# Patient Record
Sex: Male | Born: 1950 | Race: White | Hispanic: No | Marital: Married | Smoking: Never smoker
Health system: Southern US, Community
[De-identification: ages and names within clinical notes are randomized; demographics above are authoritative.]

## PROBLEM LIST (undated history)

## (undated) DIAGNOSIS — E785 Hyperlipidemia, unspecified: Secondary | ICD-10-CM

## (undated) DIAGNOSIS — R7301 Impaired fasting glucose: Secondary | ICD-10-CM

## (undated) HISTORY — DX: Impaired fasting glucose: R73.01

## (undated) HISTORY — DX: Hyperlipidemia, unspecified: E78.5

---

## 2003-01-17 ENCOUNTER — Ambulatory Visit (HOSPITAL_COMMUNITY): Admission: RE | Admit: 2003-01-17 | Discharge: 2003-01-17 | Payer: Self-pay | Admitting: Gastroenterology

## 2006-01-26 ENCOUNTER — Encounter: Admission: RE | Admit: 2006-01-26 | Discharge: 2006-01-26 | Payer: Self-pay | Admitting: Orthopedic Surgery

## 2010-11-18 ENCOUNTER — Encounter: Payer: Self-pay | Admitting: Orthopedic Surgery

## 2017-04-28 ENCOUNTER — Other Ambulatory Visit: Payer: Self-pay | Admitting: Family Medicine

## 2017-04-28 ENCOUNTER — Ambulatory Visit
Admission: RE | Admit: 2017-04-28 | Discharge: 2017-04-28 | Disposition: A | Discharge status: Home / Self Care | Payer: Medicare Other | Source: Ambulatory Visit | Attending: Family Medicine | Admitting: Family Medicine

## 2017-04-28 DIAGNOSIS — M545 Low back pain, unspecified: Secondary | ICD-10-CM

## 2017-04-28 DIAGNOSIS — R3129 Other microscopic hematuria: Secondary | ICD-10-CM

## 2017-04-29 ENCOUNTER — Other Ambulatory Visit: Payer: Self-pay | Admitting: Family Medicine

## 2017-04-29 DIAGNOSIS — K769 Liver disease, unspecified: Secondary | ICD-10-CM

## 2017-05-11 ENCOUNTER — Ambulatory Visit
Admission: RE | Admit: 2017-05-11 | Discharge: 2017-05-11 | Disposition: A | Discharge status: Home / Self Care | Payer: Medicare Other | Source: Ambulatory Visit | Attending: Family Medicine | Admitting: Family Medicine

## 2017-05-11 DIAGNOSIS — K769 Liver disease, unspecified: Secondary | ICD-10-CM

## 2017-05-11 MED ORDER — GADOBENATE DIMEGLUMINE 529 MG/ML IV SOLN
16.0000 mL | Freq: Once | INTRAVENOUS | Status: AC | PRN
  Administered 2017-05-11: 16 mL via INTRAVENOUS

## 2019-11-23 ENCOUNTER — Ambulatory Visit: Payer: Medicare Other

## 2019-12-02 ENCOUNTER — Ambulatory Visit: Payer: Medicare PPO | Attending: Internal Medicine

## 2019-12-02 DIAGNOSIS — Z23 Encounter for immunization: Secondary | ICD-10-CM | POA: Insufficient documentation

## 2019-12-02 NOTE — Progress Notes (Signed)
   Covid-19 Vaccination Clinic  Name:  Derrick Allen    MRN: [id number] DOB: Dec 10, 1950  12/02/2019  Derrick Allen was observed post Covid-19 immunization for 15 minutes without incidence. He was provided with Vaccine Information Sheet and instruction to access the V-Safe system.   Derrick Allen was instructed to call 911 with any severe reactions post vaccine: Marland Kitchen Difficulty breathing  . Swelling of your face and throat  . A fast heartbeat  . A bad rash all over your body  . Dizziness and weakness    Immunizations Administered    Name Date Dose VIS Date Route   Pfizer COVID-19 Vaccine 12/02/2019  6:06 PM 0.3 mL 10/08/2019 Intramuscular   Manufacturer: [place]   Lot: [id number]   [place]: [id number]

## 2019-12-04 ENCOUNTER — Ambulatory Visit: Payer: Medicare Other

## 2019-12-28 ENCOUNTER — Ambulatory Visit: Payer: Medicare PPO | Attending: Internal Medicine

## 2019-12-28 DIAGNOSIS — Z23 Encounter for immunization: Secondary | ICD-10-CM | POA: Insufficient documentation

## 2019-12-28 NOTE — Progress Notes (Signed)
   Covid-19 Vaccination Clinic  Name:  Derrick Allen    MRN: [id number] DOB: [birth year]  12/28/2019  [name] was observed post Covid-19 immunization for 15 minutes without incident. He was provided with Vaccine Information Sheet and instruction to access the V-Safe system.   [name] was instructed to call 911 with any severe reactions post vaccine: Marland Kitchen Difficulty breathing  . Swelling of face and throat  . A fast heartbeat  . A bad rash all over body  . Dizziness and weakness   Immunizations Administered    Name Date Dose VIS Date Route   Pfizer COVID-19 Vaccine 12/28/2019  9:25 AM 0.3 mL 10/08/2019 Intramuscular   Manufacturer: [place]   Lot: [id number]   [place]: [id number]

## 2019-12-31 DIAGNOSIS — G43019 Migraine without aura, intractable, without status migrainosus: Secondary | ICD-10-CM | POA: Diagnosis not present

## 2019-12-31 DIAGNOSIS — G43719 Chronic migraine without aura, intractable, without status migrainosus: Secondary | ICD-10-CM | POA: Diagnosis not present

## 2020-01-12 DIAGNOSIS — M47818 Spondylosis without myelopathy or radiculopathy, sacral and sacrococcygeal region: Secondary | ICD-10-CM | POA: Diagnosis not present

## 2020-01-12 DIAGNOSIS — M47816 Spondylosis without myelopathy or radiculopathy, lumbar region: Secondary | ICD-10-CM | POA: Diagnosis not present

## 2020-01-12 DIAGNOSIS — R030 Elevated blood-pressure reading, without diagnosis of hypertension: Secondary | ICD-10-CM | POA: Diagnosis not present

## 2020-01-12 DIAGNOSIS — M545 Low back pain: Secondary | ICD-10-CM | POA: Diagnosis not present

## 2020-02-08 DIAGNOSIS — M47818 Spondylosis without myelopathy or radiculopathy, sacral and sacrococcygeal region: Secondary | ICD-10-CM | POA: Diagnosis not present

## 2020-03-08 DIAGNOSIS — M5416 Radiculopathy, lumbar region: Secondary | ICD-10-CM | POA: Diagnosis not present

## 2020-03-08 DIAGNOSIS — M47816 Spondylosis without myelopathy or radiculopathy, lumbar region: Secondary | ICD-10-CM | POA: Diagnosis not present

## 2020-03-08 DIAGNOSIS — M47818 Spondylosis without myelopathy or radiculopathy, sacral and sacrococcygeal region: Secondary | ICD-10-CM | POA: Diagnosis not present

## 2020-03-08 DIAGNOSIS — M545 Low back pain: Secondary | ICD-10-CM | POA: Diagnosis not present

## 2020-03-14 ENCOUNTER — Other Ambulatory Visit: Payer: Self-pay | Admitting: Neurosurgery

## 2020-03-14 DIAGNOSIS — M5416 Radiculopathy, lumbar region: Secondary | ICD-10-CM

## 2020-04-15 ENCOUNTER — Other Ambulatory Visit: Payer: Self-pay

## 2020-04-15 ENCOUNTER — Ambulatory Visit
Admission: RE | Admit: 2020-04-15 | Discharge: 2020-04-15 | Disposition: A | Discharge status: Home / Self Care | Payer: Medicare PPO | Source: Ambulatory Visit | Attending: Neurosurgery | Admitting: Neurosurgery

## 2020-04-15 DIAGNOSIS — M5416 Radiculopathy, lumbar region: Secondary | ICD-10-CM

## 2020-04-15 DIAGNOSIS — M48061 Spinal stenosis, lumbar region without neurogenic claudication: Secondary | ICD-10-CM | POA: Diagnosis not present

## 2020-04-19 DIAGNOSIS — M5136 Other intervertebral disc degeneration, lumbar region: Secondary | ICD-10-CM | POA: Diagnosis not present

## 2020-04-19 DIAGNOSIS — M48061 Spinal stenosis, lumbar region without neurogenic claudication: Secondary | ICD-10-CM | POA: Diagnosis not present

## 2020-04-19 DIAGNOSIS — M47816 Spondylosis without myelopathy or radiculopathy, lumbar region: Secondary | ICD-10-CM | POA: Diagnosis not present

## 2020-04-19 DIAGNOSIS — M545 Low back pain: Secondary | ICD-10-CM | POA: Diagnosis not present

## 2020-04-20 DIAGNOSIS — H401231 Low-tension glaucoma, bilateral, mild stage: Secondary | ICD-10-CM | POA: Diagnosis not present

## 2020-04-28 DIAGNOSIS — M47816 Spondylosis without myelopathy or radiculopathy, lumbar region: Secondary | ICD-10-CM | POA: Diagnosis not present

## 2020-05-22 DIAGNOSIS — M47816 Spondylosis without myelopathy or radiculopathy, lumbar region: Secondary | ICD-10-CM | POA: Diagnosis not present

## 2020-06-14 DIAGNOSIS — M545 Low back pain: Secondary | ICD-10-CM | POA: Diagnosis not present

## 2020-06-14 DIAGNOSIS — M47816 Spondylosis without myelopathy or radiculopathy, lumbar region: Secondary | ICD-10-CM | POA: Diagnosis not present

## 2020-06-14 DIAGNOSIS — M48061 Spinal stenosis, lumbar region without neurogenic claudication: Secondary | ICD-10-CM | POA: Diagnosis not present

## 2020-06-14 DIAGNOSIS — M5136 Other intervertebral disc degeneration, lumbar region: Secondary | ICD-10-CM | POA: Diagnosis not present

## 2020-07-25 DIAGNOSIS — G43019 Migraine without aura, intractable, without status migrainosus: Secondary | ICD-10-CM | POA: Diagnosis not present

## 2020-07-25 DIAGNOSIS — G43719 Chronic migraine without aura, intractable, without status migrainosus: Secondary | ICD-10-CM | POA: Diagnosis not present

## 2020-07-27 DIAGNOSIS — E785 Hyperlipidemia, unspecified: Secondary | ICD-10-CM | POA: Diagnosis not present

## 2020-07-27 DIAGNOSIS — Z8669 Personal history of other diseases of the nervous system and sense organs: Secondary | ICD-10-CM | POA: Diagnosis not present

## 2020-07-27 DIAGNOSIS — H4089 Other specified glaucoma: Secondary | ICD-10-CM | POA: Diagnosis not present

## 2020-07-27 DIAGNOSIS — Z79899 Other long term (current) drug therapy: Secondary | ICD-10-CM | POA: Diagnosis not present

## 2020-07-27 DIAGNOSIS — N400 Enlarged prostate without lower urinary tract symptoms: Secondary | ICD-10-CM | POA: Diagnosis not present

## 2020-07-27 DIAGNOSIS — Z125 Encounter for screening for malignant neoplasm of prostate: Secondary | ICD-10-CM | POA: Diagnosis not present

## 2020-07-27 DIAGNOSIS — Z0000 Encounter for general adult medical examination without abnormal findings: Secondary | ICD-10-CM | POA: Diagnosis not present

## 2020-07-27 DIAGNOSIS — R7309 Other abnormal glucose: Secondary | ICD-10-CM | POA: Diagnosis not present

## 2020-07-27 DIAGNOSIS — Z86010 Personal history of colonic polyps: Secondary | ICD-10-CM | POA: Diagnosis not present

## 2020-07-27 DIAGNOSIS — R7303 Prediabetes: Secondary | ICD-10-CM | POA: Diagnosis not present

## 2020-08-12 ENCOUNTER — Ambulatory Visit: Payer: Medicare PPO

## 2020-08-24 ENCOUNTER — Other Ambulatory Visit: Payer: Self-pay

## 2020-08-24 ENCOUNTER — Encounter: Payer: Self-pay | Admitting: Cardiology

## 2020-08-24 ENCOUNTER — Ambulatory Visit: Payer: Medicare PPO | Admitting: Cardiology

## 2020-08-24 VITALS — BP 118/70 | HR 60 | Ht 72.0 in | Wt 167.0 lb

## 2020-08-24 DIAGNOSIS — Z7182 Exercise counseling: Secondary | ICD-10-CM

## 2020-08-24 DIAGNOSIS — Z7189 Other specified counseling: Secondary | ICD-10-CM

## 2020-08-24 DIAGNOSIS — R7301 Impaired fasting glucose: Secondary | ICD-10-CM | POA: Diagnosis not present

## 2020-08-24 DIAGNOSIS — E785 Hyperlipidemia, unspecified: Secondary | ICD-10-CM

## 2020-08-24 DIAGNOSIS — R011 Cardiac murmur, unspecified: Secondary | ICD-10-CM | POA: Diagnosis not present

## 2020-08-24 DIAGNOSIS — Z713 Dietary counseling and surveillance: Secondary | ICD-10-CM | POA: Diagnosis not present

## 2020-08-24 NOTE — Patient Instructions (Signed)
Medication Instructions:  Your Physician recommend you continue on your current medication as directed.    *If you need a refill on your cardiac medications before your next appointment, please call your pharmacy*   Lab Work: None ordered   Testing/Procedures: Your physician has requested that you have an echocardiogram. Echocardiography is a painless test that uses sound waves to create images of your heart. It provides your doctor with information about the size and shape of your heart and how well your heart's chambers and valves are working. This procedure takes approximately one hour. There are no restrictions for this procedure. [place] [address]    Follow-Up: At [facility], you and your health needs are our priority.  As part of our continuing mission to provide you with exceptional heart care, we have created designated Provider Care Teams.  These Care Teams include your primary Cardiologist (physician) and Advanced Practice Providers (APPs -  Physician Assistants and Nurse Practitioners) who all work together to provide you with the care you need, when you need it.  We recommend signing up for the patient portal called "MyChart".  Sign up information is provided on this After Visit Summary.  MyChart is used to connect with patients for Virtual Visits (Telemedicine).  Patients are able to view lab/test results, encounter notes, upcoming appointments, etc.  Non-urgent messages can be sent to your provider as well.   To learn more about what you can do with MyChart, go to [email or web address].    Your next appointment:   As needed  The format for your next appointment:   In Person  Provider:   Buford Dresser, MD

## 2020-08-24 NOTE — Progress Notes (Signed)
Cardiology Office Note:    Date:  08/24/2020   ID:  Derrick Allen, DOB 07-08-1951, MRN [id number]  PCP:  Hulan Fess, MD  Cardiologist:  Buford Dresser, MD  Referring MD: Hulan Fess, MD   CC: new patient evaluation for murmur  History of Present Illness:    Derrick Allen is a 69 y.o. male with a hx of impaired fasting glucose, dyslipidemia who is seen as a new consult at the request of Hulan Fess, MD for the evaluation and management of murmur.  Note from 07/27/20 with Dr. Rex Kras reviewed. Noted to have 2/6 systolic murmur on exam.   Cardiovascular risk factors: Prior clinical ASCVD: never Comorbid conditions: Denies hypertension, chronic kidney disease. Endorses abnormal lipids, prediabetes Metabolic syndrome/Obesity: highest adult weight 180 lbs, BMI 22 today Chronic inflammatory conditions: none Tobacco use history: never Family history: no family history of heart disease that he knows of. Mother had a stroke around age 37.  Prior cardiac testing and/or incidental findings on other testing (ie coronary calcium): none Exercise level: goes to the gym daily. Does strenuous cardio without issue, at least 60 mins 6 days/week.  Current diet: generally healthy. Oatmeal, fruit, boiled egg for breakfast. Rare bacon. Lunch is greek yogurt, [place] breast, white cheese. Dinner is cooked by his wife, generally very healthy, usually chicken/fish/pork, rare red meat, with a vegetable. Has one shot of alcohol nightly. Working to cut back on sweets, this is his weakness.  Past Medical History:  Diagnosis Date  . Dyslipidemia   . Impaired fasting glucose     History reviewed. No pertinent surgical history.  Current Medications: Current Outpatient Medications on File Prior to Visit  Medication Sig  . latanoprost (XALATAN) 0.005 % ophthalmic solution Place 1 drop into both eyes daily.  . simvastatin (ZOCOR) 40 MG tablet Take 40 mg by mouth daily.  . SUMAtriptan (IMITREX) 25 MG  tablet Take 25 mg by mouth as needed.  . venlafaxine XR (EFFEXOR-XR) 150 MG 24 hr capsule Take 150 mg by mouth daily.  Marland Kitchen zonisamide (ZONEGRAN) 100 MG capsule Take 100 mg by mouth daily.   No current facility-administered medications on file prior to visit.     Allergies:   Amoxicillin   Social History   Tobacco Use  . Smoking status: Never Smoker  . Smokeless tobacco: Never Used  Substance Use Topics  . Alcohol use: Not on file  . Drug use: Not on file    Family History: family history includes AAA (abdominal aortic aneurysm) in his mother; Colon cancer in his mother.  ROS:   Please see the history of present illness.  Additional pertinent ROS: Constitutional: Negative for chills, fever, night sweats, unintentional weight loss  HENT: Negative for ear pain and hearing loss.   Eyes: Negative for loss of vision and eye pain.  Respiratory: Negative for cough, sputum, wheezing.   Cardiovascular: See HPI. Gastrointestinal: Negative for abdominal pain, melena, and hematochezia.  Genitourinary: Negative for dysuria and hematuria.  Musculoskeletal: Negative for falls and myalgias.  Skin: Negative for itching and rash.  Neurological: Negative for focal weakness, focal sensory changes and loss of consciousness.  Endo/Heme/Allergies: Does not bruise/bleed easily.     EKGs/Labs/Other Studies Reviewed:    The following studies were reviewed today: No prior cardiac studies  EKG:  EKG is personally reviewed.  The ekg ordered today demonstrates NSR at 60 bpm  Recent Labs: No results found for requested labs within last 8760 hours.  Recent Lipid Panel No results  found for: CHOL, TRIG, HDL, CHOLHDL, VLDL, LDLCALC, LDLDIRECT  Physical Exam:    VS:  BP 118/70 (BP Location: Left Arm, Patient Position: Sitting, Cuff Size: Normal)   Pulse 60   Ht 6' (1.829 m)   Wt 167 lb (75.8 kg)   BMI 22.65 kg/m     Wt Readings from Last 3 Encounters:  08/24/20 167 lb (75.8 kg)    GEN: Well  nourished, well developed in no acute distress HEENT: Normal, moist mucous membranes NECK: No JVD CARDIAC: regular rhythm, normal S1 and S2, no rubs or gallops. 2/6 systolic murmur best hear at apex VASCULAR: Radial and DP pulses 2+ bilaterally. No carotid bruits RESPIRATORY:  Clear to auscultation without rales, wheezing or rhonchi  ABDOMEN: Soft, non-tender, non-distended MUSCULOSKELETAL:  Ambulates independently SKIN: Warm and dry, no edema NEUROLOGIC:  Alert and oriented x 3. No focal neuro deficits noted. PSYCHIATRIC:  Normal affect    ASSESSMENT:    1. Murmur, cardiac   2. Cardiac risk counseling   3. Counseling on health promotion and disease prevention   4. Nutritional counseling   5. Exercise counseling   6. Dyslipidemia   7. Impaired fasting glucose    PLAN:    Murmur: -given location, quality, suspect this is mitral regurgitation. Would not be surprised if there is also component of aortic sclerosis -we discussed what causes murmurs at length today and how these are evaluated -no high risk features such as syncope, chest pain, shortness of breath/edema -will order echocardiogram for baseline  Dyslipidemia Impaired fasting glucose -per [facility], 07/27/20: -Tchol 191. HDL 79, LDL 102, TG 56 -A1c 6.1 -discussed prevention, below -excellent diet, exercise, weight -working on cutting back sweets -his ASCVD risk without diabetes is 12.7% over ten years. This is largely driven by age, and is actually under his optimal risk of 13.1% per the calculator -if he does develop diabetes, would need to discuss statin/aspirin, wishes to avoid medications at this time  Cardiac risk counseling and prevention recommendations: already doing an excellent job with this. We discussed CV recommendations at length, including nutrition and exercise -recommend heart healthy/Mediterranean diet, with whole grains, fruits, vegetable, fish, lean meats, nuts, and olive oil. Limit salt. -recommend  moderate walking, 3-5 times/week for 30-50 minutes each session. Aim for at least 150 minutes.week. Goal should be pace of 3 miles/hours, or walking 1.5 miles in 30 minutes -recommend avoidance of tobacco products. Avoid excess alcohol.  Plan for follow up: if echo without high risk findings, can follow up as needed  Buford Dresser, MD, PhD [place]  [facility]    Medication Adjustments/Labs and Tests Ordered: Current medicines are reviewed at length with the patient today.  Concerns regarding medicines are outlined above.  Orders Placed This Encounter  Procedures  . EKG 12-Lead  . ECHOCARDIOGRAM COMPLETE   No orders of the defined types were placed in this encounter.   Patient Instructions  Medication Instructions:  Your Physician recommend you continue on your current medication as directed.    *If you need a refill on your cardiac medications before your next appointment, please call your pharmacy*   Lab Work: None ordered   Testing/Procedures: Your physician has requested that you have an echocardiogram. Echocardiography is a painless test that uses sound waves to create images of your heart. It provides your doctor with information about the size and shape of your heart and how well your heart's chambers and valves are working. This procedure takes approximately one hour. There are  no restrictions for this procedure. [place] [address]    Follow-Up: At [facility], you and your health needs are our priority.  As part of our continuing mission to provide you with exceptional heart care, we have created designated Provider Care Teams.  These Care Teams include your primary Cardiologist (physician) and Advanced Practice Providers (APPs -  Physician Assistants and Nurse Practitioners) who all work together to provide you with the care you need, when you need it.  We recommend signing up for the patient portal called "MyChart".  Sign up information  is provided on this After Visit Summary.  MyChart is used to connect with patients for Virtual Visits (Telemedicine).  Patients are able to view lab/test results, encounter notes, upcoming appointments, etc.  Non-urgent messages can be sent to your provider as well.   To learn more about what you can do with MyChart, go to [email or web address].    Your next appointment:   As needed  The format for your next appointment:   In Person  Provider:   Buford Dresser, MD       Signed, Buford Dresser, MD PhD 08/24/2020 1:03 PM    [place]

## 2020-09-19 ENCOUNTER — Ambulatory Visit (HOSPITAL_COMMUNITY): Payer: Medicare PPO | Attending: Internal Medicine

## 2020-09-19 ENCOUNTER — Other Ambulatory Visit: Payer: Self-pay

## 2020-09-19 DIAGNOSIS — R011 Cardiac murmur, unspecified: Secondary | ICD-10-CM | POA: Insufficient documentation

## 2020-09-19 LAB — ECHOCARDIOGRAM COMPLETE
AR max vel: 2.14 cm2
AV Area VTI: 2.14 cm2
AV Area mean vel: 2.18 cm2
AV Mean grad: 9 mmHg
AV Peak grad: 14.4 mmHg
Ao pk vel: 1.9 m/s
Area-P 1/2: 3.31 cm2
S' Lateral: 2.8 cm

## 2020-09-27 ENCOUNTER — Ambulatory Visit: Payer: Medicare PPO | Admitting: Dermatology

## 2020-09-27 ENCOUNTER — Other Ambulatory Visit: Payer: Self-pay

## 2020-09-27 DIAGNOSIS — L309 Dermatitis, unspecified: Secondary | ICD-10-CM

## 2020-09-27 DIAGNOSIS — L570 Actinic keratosis: Secondary | ICD-10-CM | POA: Diagnosis not present

## 2020-09-27 MED ORDER — HALOBETASOL PROPIONATE 0.05 % EX CREA: TOPICAL_CREAM | Freq: Two times a day (BID) | CUTANEOUS | 0 refills | Status: DC | PRN

## 2020-09-27 MED ORDER — TOLAK 4 % EX CREA: 1.0000 "application " | TOPICAL_CREAM | Freq: Every day | CUTANEOUS | 0 refills | Status: AC

## 2020-10-01 ENCOUNTER — Encounter: Payer: Self-pay | Admitting: Dermatology

## 2020-10-01 NOTE — Progress Notes (Signed)
   Follow-Up Visit   Subjective  Derrick Allen is a 69 y.o. male who presents for the following: Skin Problem (Patients PCP sent him here to have a lesion on scalp looked at. Some red crusty spots. Does not bother patient but PCP says he may need some treatment. ). Crusts Location: Scalp Duration:  Quality:  Associated Signs/Symptoms: Modifying Factors:  Severity:  Timing: Context:   Objective  Well appearing patient in no apparent distress; mood and affect are within normal limits.  All skin waist up examined.   Assessment & Plan    AK (actinic keratosis) Mid Occipital Scalp  Prescribed [place].  Goal would be 28 total applications which can be used nightly to the affected area or, if too much irritation occurs, take a week off and restart every other night.  May cause moderate sun sensitivity.  Please call if there is too much irritation.  Ordered Medications: Fluorouracil (TOLAK) 4 % CREA  Dermatitis Gluteal Crease  Halobetasol will be applied once daily after bathing for maximum 30 days.  Please call the office at that time.  If improved, we will use a milder long-term cortisone or perhaps a noncortisone prescription like tacrolimus ointment.  Ordered Medications: halobetasol (ULTRAVATE) 0.05 % cream     I, Lavonna Monarch, MD, have reviewed all documentation for this visit.  The documentation on 10/01/20 for the exam, diagnosis, procedures, and orders are all accurate and complete.

## 2020-10-04 DIAGNOSIS — J019 Acute sinusitis, unspecified: Secondary | ICD-10-CM | POA: Diagnosis not present

## 2020-12-08 DIAGNOSIS — M19012 Primary osteoarthritis, left shoulder: Secondary | ICD-10-CM | POA: Diagnosis not present

## 2020-12-08 DIAGNOSIS — M1712 Unilateral primary osteoarthritis, left knee: Secondary | ICD-10-CM | POA: Diagnosis not present

## 2020-12-12 DIAGNOSIS — H401231 Low-tension glaucoma, bilateral, mild stage: Secondary | ICD-10-CM | POA: Diagnosis not present

## 2021-01-12 DIAGNOSIS — G43019 Migraine without aura, intractable, without status migrainosus: Secondary | ICD-10-CM | POA: Diagnosis not present

## 2021-01-12 DIAGNOSIS — G43719 Chronic migraine without aura, intractable, without status migrainosus: Secondary | ICD-10-CM | POA: Diagnosis not present

## 2021-01-19 DIAGNOSIS — M1712 Unilateral primary osteoarthritis, left knee: Secondary | ICD-10-CM | POA: Diagnosis not present

## 2021-02-28 ENCOUNTER — Ambulatory Visit: Payer: Medicare PPO | Admitting: Dermatology

## 2021-02-28 ENCOUNTER — Encounter: Payer: Self-pay | Admitting: Dermatology

## 2021-02-28 ENCOUNTER — Other Ambulatory Visit: Payer: Self-pay

## 2021-02-28 DIAGNOSIS — Z1283 Encounter for screening for malignant neoplasm of skin: Secondary | ICD-10-CM

## 2021-02-28 DIAGNOSIS — D1801 Hemangioma of skin and subcutaneous tissue: Secondary | ICD-10-CM

## 2021-02-28 DIAGNOSIS — L738 Other specified follicular disorders: Secondary | ICD-10-CM

## 2021-02-28 DIAGNOSIS — L821 Other seborrheic keratosis: Secondary | ICD-10-CM | POA: Diagnosis not present

## 2021-03-15 ENCOUNTER — Encounter: Payer: Self-pay | Admitting: Dermatology

## 2021-03-15 NOTE — Progress Notes (Signed)
   Follow-Up Visit   Subjective  Derrick Allen is a 70 y.o. male who presents for the following: Annual Exam (Check back per wife- no new concerns).  General skin examination Location:  Duration:  Quality: Wife has noticed some change in spots on back Associated Signs/Symptoms: Modifying Factors:  Severity:  Timing: Context:   Objective  Well appearing patient in no apparent distress; mood and affect are within normal limits. Objective  Mid Back: Full body skin exam. No atypical moles or non mole skin cancers.   Objective  Mid Back: Tan textured brown flattopped 3-day millimeter papules lesions.   Objective  Left Forehead: Subtle 2 mm flesh-colored eccentrically delled papule  Objective  Chest - Medial [facility]): 2 mm smooth red papule    A full examination was performed including scalp, head, eyes, ears, nose, lips, neck, chest, axillae, abdomen, back, buttocks, bilateral upper extremities, bilateral lower extremities, hands, feet, fingers, toes, fingernails, and toenails. All findings within normal limits unless otherwise noted below.   Assessment & Plan    Screening for malignant neoplasm of skin Mid Back  Yearly skin exam.  Encouraged to self examine with spouse twice annually.  Continue ultraviolet protection.  Seborrheic keratosis Mid Back  Benign no treatment needed if stable.  Sebaceous hyperplasia of face Left Forehead  Discussed resemblance to early [facility] so if such a lesion were to grow or bleed he will return for biopsy.  Hemangioma of skin Chest - Medial [facility])  No intervention necessary      I, Lavonna Monarch, MD, have reviewed all documentation for this visit.  The documentation on 03/15/21 for the exam, diagnosis, procedures, and orders are all accurate and complete.

## 2021-04-06 DIAGNOSIS — M1711 Unilateral primary osteoarthritis, right knee: Secondary | ICD-10-CM | POA: Diagnosis not present

## 2021-04-09 DIAGNOSIS — R030 Elevated blood-pressure reading, without diagnosis of hypertension: Secondary | ICD-10-CM | POA: Diagnosis not present

## 2021-04-09 DIAGNOSIS — G8929 Other chronic pain: Secondary | ICD-10-CM | POA: Diagnosis not present

## 2021-04-09 DIAGNOSIS — F419 Anxiety disorder, unspecified: Secondary | ICD-10-CM | POA: Diagnosis not present

## 2021-04-09 DIAGNOSIS — G43909 Migraine, unspecified, not intractable, without status migrainosus: Secondary | ICD-10-CM | POA: Diagnosis not present

## 2021-04-09 DIAGNOSIS — Z7722 Contact with and (suspected) exposure to environmental tobacco smoke (acute) (chronic): Secondary | ICD-10-CM | POA: Diagnosis not present

## 2021-04-09 DIAGNOSIS — H409 Unspecified glaucoma: Secondary | ICD-10-CM | POA: Diagnosis not present

## 2021-04-09 DIAGNOSIS — E785 Hyperlipidemia, unspecified: Secondary | ICD-10-CM | POA: Diagnosis not present

## 2021-04-09 DIAGNOSIS — F324 Major depressive disorder, single episode, in partial remission: Secondary | ICD-10-CM | POA: Diagnosis not present

## 2021-04-09 DIAGNOSIS — M1990 Unspecified osteoarthritis, unspecified site: Secondary | ICD-10-CM | POA: Diagnosis not present

## 2021-05-27 IMAGING — MR MR LUMBAR SPINE W/O CM
4 of 5 series · 24 of 48 positions shown · non-contrast
Comparison: MR lumbar spine at [REDACTED] 10/07/2017.

CLINICAL DATA: Left-sided low back pain with progression over the
last 10 months. Injection 6 weeks ago did not help.

EXAM:
MRI LUMBAR SPINE WITHOUT CONTRAST
TECHNIQUE: Multiplanar, multisequence MR imaging of the lumbar spine was
performed. No intravenous contrast was administered.

[Series 2: T2 · sagittal · 4.0mm · 0.55mm/px · 6 of 16 slices shown (1 of 2)]
[im 1/16]
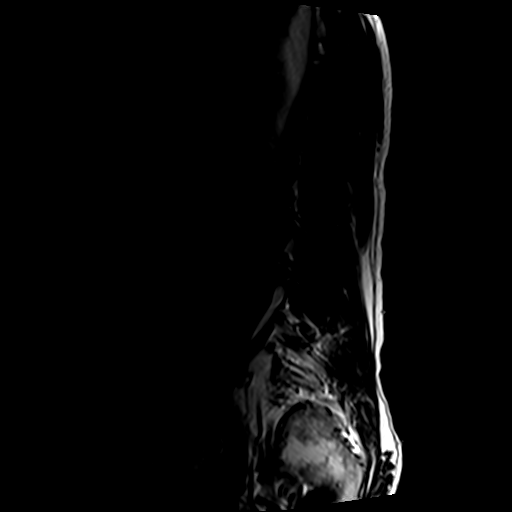
[im 4/16]
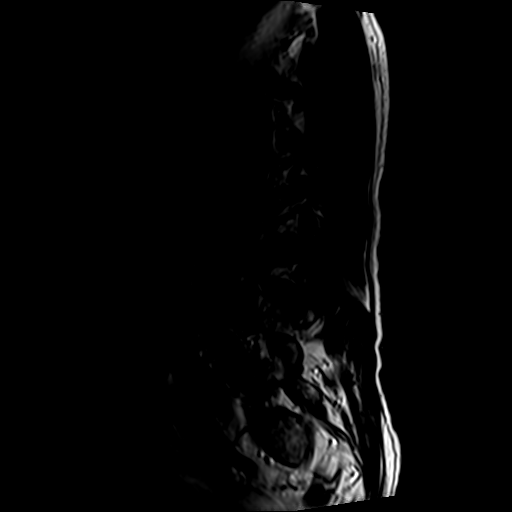
[im 7/16]
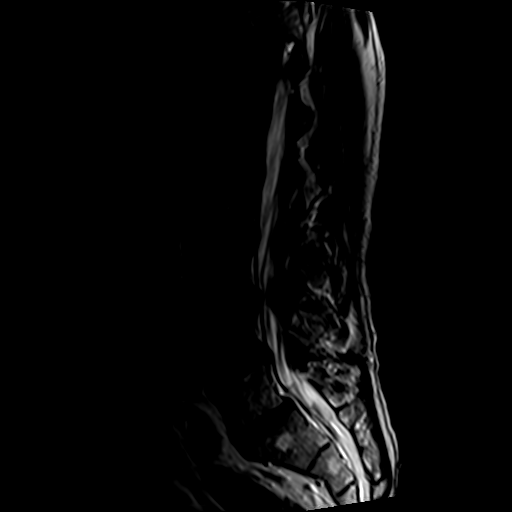
[im 10/16]
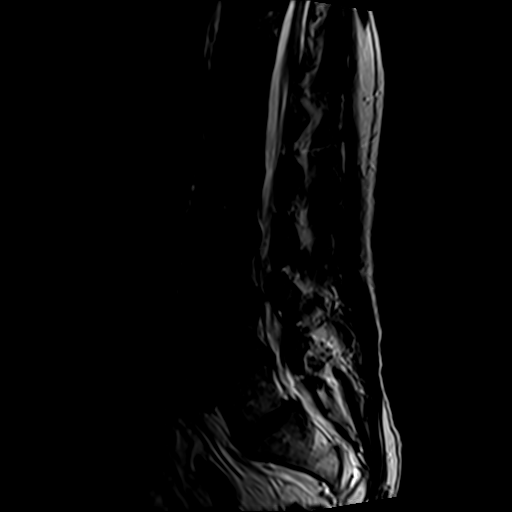
[im 13/16]
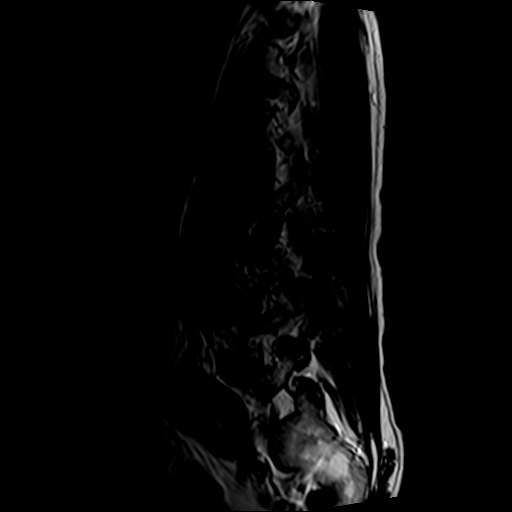
[im 16/16]
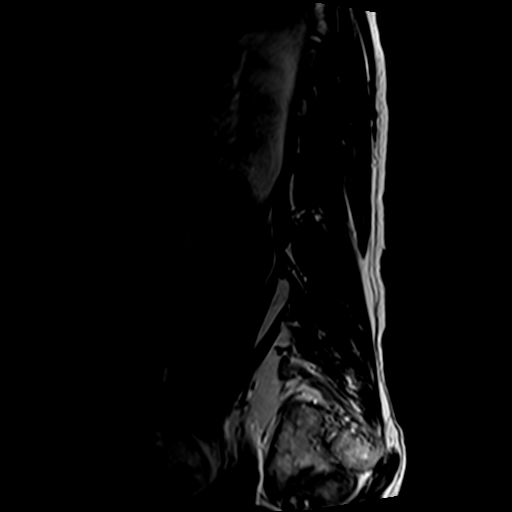

[Series 4: T1 · sagittal · 4.0mm · 0.55mm/px · 6 of 16 slices shown (1 of 2)]
[im 1/16]
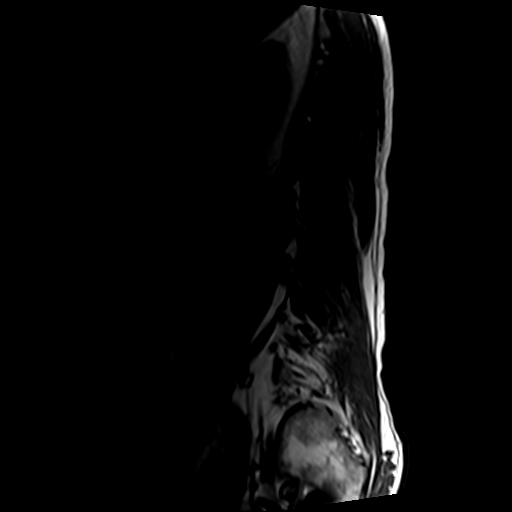
[im 4/16]
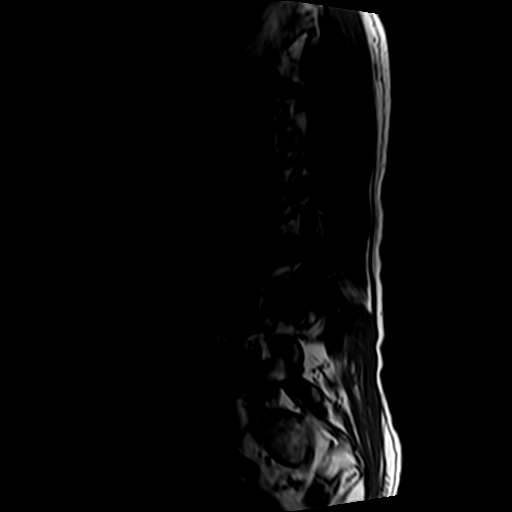
[im 7/16]
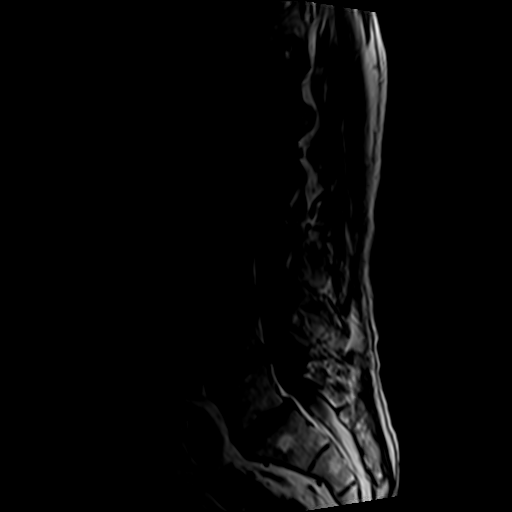
[im 10/16]
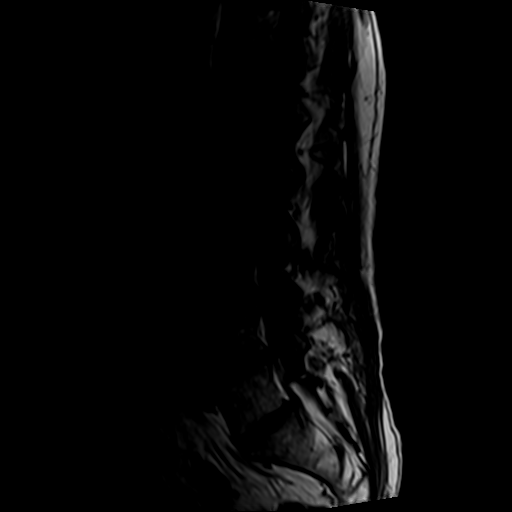
[im 13/16]
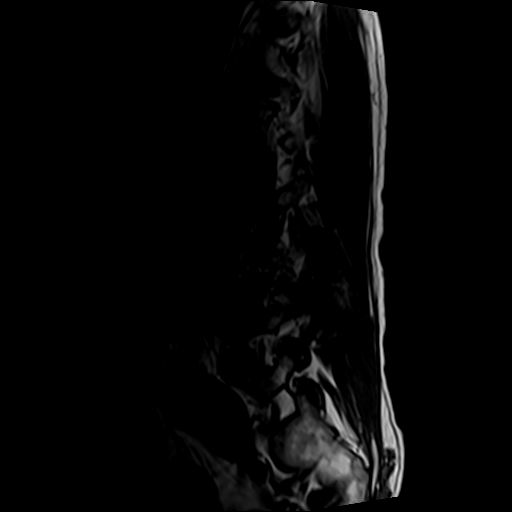
[im 16/16]
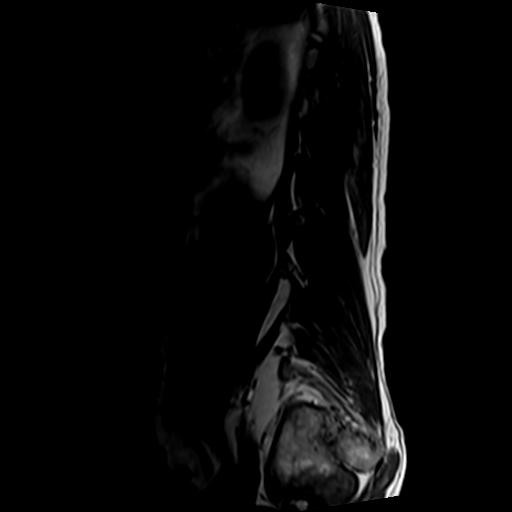

[Series 5: T2 · axial · 4.0mm · 0.70mm/px · z∈[-135,+85]mm · 9 of 40 slices shown (2 of 2)]
[im 1/40]
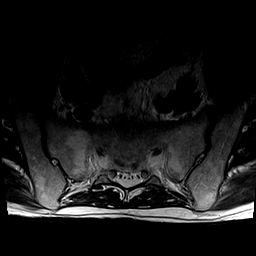
[im 6/40]
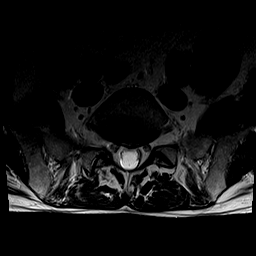
[im 12/40]
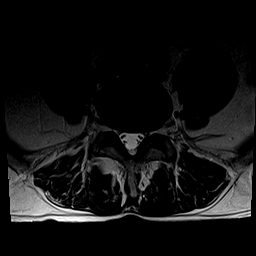
[im 17/40]
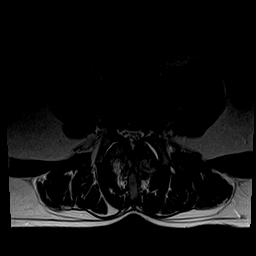
[im 20/40]
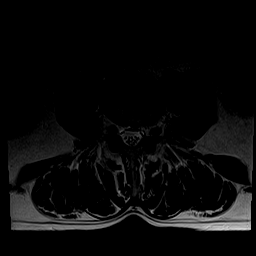
[im 23/40]
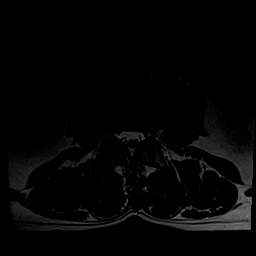
[im 28/40]
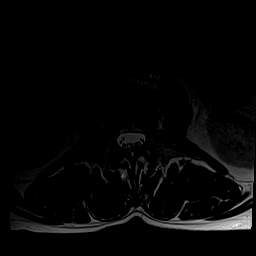
[im 34/40]
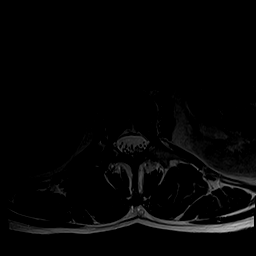
[im 40/40]
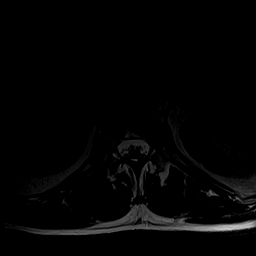

[Series 6: T1 · axial · 4.0mm · 0.35mm/px · z∈[-109,+54]mm · 3 of 40 slices shown (2 of 2)]
[im 6/40]
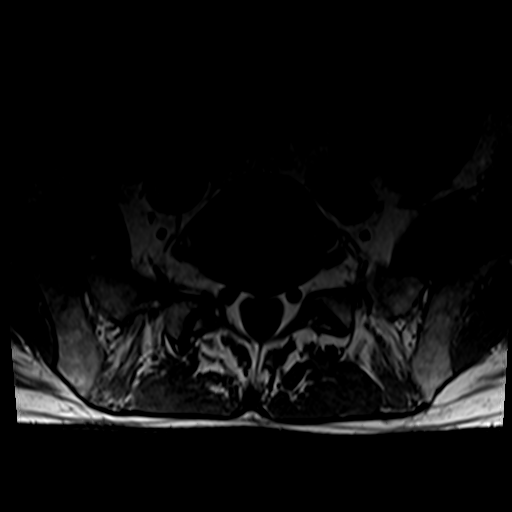
[im 20/40]
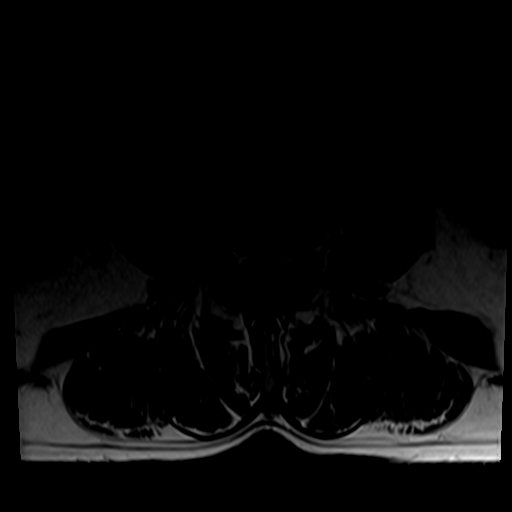
[im 34/40]
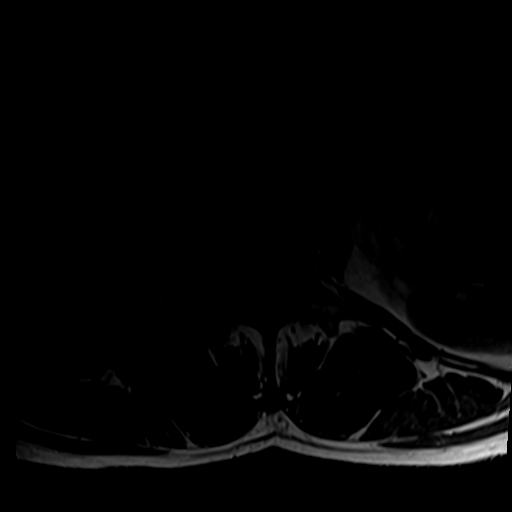

[24 of 48 positions shown; findings below may reference images not displayed]

FINDINGS: Segmentation: 5 non rib-bearing lumbar type vertebral bodies are
present. The lowest fully formed vertebral body is L5.

Alignment: Slight anterolisthesis at L3-4 and L5-S1 is stable from
the prior exams.

Vertebrae: Marrow signal and vertebral body heights are normal.
Hemangioma is present at S1 is tube. Fatty infiltration is noted
along the sacral ala.

Conus medullaris and cauda equina: Conus extends to the L1 level.
Conus and cauda equina appear normal.

Paraspinal and other soft tissues: Limited imaging the abdomen is
unremarkable. There is no significant adenopathy. No solid organ
lesions are present.

Disc levels:

T12-L1: Negative.

L1-2: Minimal facet hypertrophy is present without significant
stenosis.

L2-3: Mild broad-based disc bulge is present. Mild facet hypertrophy
is noted bilaterally. No significant stenosis is present.

L3-4: A progressive broad-based disc protrusion and progressive
moderate bilateral facet hypertrophy is noted. Mild subarticular
narrowing is now present, left greater than right. Moderate
foraminal stenosis is present bilaterally.

L4-5: A broad-based disc protrusion is present. Central annular tear
is noted. Mild facet hypertrophy is noted bilaterally. No
significant interval change or stenosis is present.

L5-S1: A shallow central disc protrusion and annular tear is similar
the prior study. Moderate facet hypertrophy on the right
demonstrates slight progression.
IMPRESSION: 1. Progressive broad-based disc protrusion and progressive moderate
bilateral facet hypertrophy at L3-4 with mild subarticular
narrowing, left greater than right.
2. Moderate foraminal stenosis bilaterally at L3-4 is new.
3. Broad-based disc protrusion and facet hypertrophy at L4-5 is
stable.
4. Shallow central disc protrusion and annular tear at L5-S1 is
similar the prior study.
5. Mild facet hypertrophy at L1-2 and L2-3 without significant
stenosis.

## 2021-06-12 DIAGNOSIS — H524 Presbyopia: Secondary | ICD-10-CM | POA: Diagnosis not present

## 2021-06-12 DIAGNOSIS — H401231 Low-tension glaucoma, bilateral, mild stage: Secondary | ICD-10-CM | POA: Diagnosis not present

## 2021-06-12 DIAGNOSIS — H5213 Myopia, bilateral: Secondary | ICD-10-CM | POA: Diagnosis not present

## 2021-07-05 DIAGNOSIS — G43719 Chronic migraine without aura, intractable, without status migrainosus: Secondary | ICD-10-CM | POA: Diagnosis not present

## 2021-07-05 DIAGNOSIS — G43019 Migraine without aura, intractable, without status migrainosus: Secondary | ICD-10-CM | POA: Diagnosis not present

## 2021-08-17 DIAGNOSIS — F419 Anxiety disorder, unspecified: Secondary | ICD-10-CM | POA: Diagnosis not present

## 2021-08-17 DIAGNOSIS — E7800 Pure hypercholesterolemia, unspecified: Secondary | ICD-10-CM | POA: Diagnosis not present

## 2021-08-17 DIAGNOSIS — Z1389 Encounter for screening for other disorder: Secondary | ICD-10-CM | POA: Diagnosis not present

## 2021-08-17 DIAGNOSIS — N400 Enlarged prostate without lower urinary tract symptoms: Secondary | ICD-10-CM | POA: Diagnosis not present

## 2021-08-17 DIAGNOSIS — R7303 Prediabetes: Secondary | ICD-10-CM | POA: Diagnosis not present

## 2021-08-17 DIAGNOSIS — F338 Other recurrent depressive disorders: Secondary | ICD-10-CM | POA: Diagnosis not present

## 2021-08-17 DIAGNOSIS — Z23 Encounter for immunization: Secondary | ICD-10-CM | POA: Diagnosis not present

## 2021-08-17 DIAGNOSIS — Z8669 Personal history of other diseases of the nervous system and sense organs: Secondary | ICD-10-CM | POA: Diagnosis not present

## 2021-08-17 DIAGNOSIS — Z0000 Encounter for general adult medical examination without abnormal findings: Secondary | ICD-10-CM | POA: Diagnosis not present

## 2021-08-21 DIAGNOSIS — H401231 Low-tension glaucoma, bilateral, mild stage: Secondary | ICD-10-CM | POA: Diagnosis not present

## 2021-08-28 DIAGNOSIS — M79641 Pain in right hand: Secondary | ICD-10-CM | POA: Diagnosis not present

## 2021-09-26 DIAGNOSIS — R946 Abnormal results of thyroid function studies: Secondary | ICD-10-CM | POA: Diagnosis not present

## 2021-09-26 DIAGNOSIS — R7989 Other specified abnormal findings of blood chemistry: Secondary | ICD-10-CM | POA: Diagnosis not present

## 2021-12-31 DIAGNOSIS — G43719 Chronic migraine without aura, intractable, without status migrainosus: Secondary | ICD-10-CM | POA: Diagnosis not present

## 2022-02-20 ENCOUNTER — Other Ambulatory Visit: Payer: Self-pay | Admitting: Dermatology

## 2022-02-20 DIAGNOSIS — L309 Dermatitis, unspecified: Secondary | ICD-10-CM

## 2022-02-21 DIAGNOSIS — M1712 Unilateral primary osteoarthritis, left knee: Secondary | ICD-10-CM | POA: Diagnosis not present

## 2022-02-21 DIAGNOSIS — M170 Bilateral primary osteoarthritis of knee: Secondary | ICD-10-CM | POA: Diagnosis not present

## 2022-02-26 DIAGNOSIS — H401111 Primary open-angle glaucoma, right eye, mild stage: Secondary | ICD-10-CM | POA: Diagnosis not present

## 2022-02-26 DIAGNOSIS — H2513 Age-related nuclear cataract, bilateral: Secondary | ICD-10-CM | POA: Diagnosis not present

## 2022-06-25 DIAGNOSIS — M1712 Unilateral primary osteoarthritis, left knee: Secondary | ICD-10-CM | POA: Diagnosis not present

## 2022-07-04 DIAGNOSIS — H5213 Myopia, bilateral: Secondary | ICD-10-CM | POA: Diagnosis not present

## 2022-07-04 DIAGNOSIS — H401131 Primary open-angle glaucoma, bilateral, mild stage: Secondary | ICD-10-CM | POA: Diagnosis not present

## 2022-07-09 DIAGNOSIS — R030 Elevated blood-pressure reading, without diagnosis of hypertension: Secondary | ICD-10-CM | POA: Diagnosis not present

## 2022-07-09 DIAGNOSIS — Z880 Allergy status to penicillin: Secondary | ICD-10-CM | POA: Diagnosis not present

## 2022-07-09 DIAGNOSIS — E785 Hyperlipidemia, unspecified: Secondary | ICD-10-CM | POA: Diagnosis not present

## 2022-07-09 DIAGNOSIS — H409 Unspecified glaucoma: Secondary | ICD-10-CM | POA: Diagnosis not present

## 2022-07-09 DIAGNOSIS — M1990 Unspecified osteoarthritis, unspecified site: Secondary | ICD-10-CM | POA: Diagnosis not present

## 2022-07-09 DIAGNOSIS — G43909 Migraine, unspecified, not intractable, without status migrainosus: Secondary | ICD-10-CM | POA: Diagnosis not present

## 2022-07-09 DIAGNOSIS — F419 Anxiety disorder, unspecified: Secondary | ICD-10-CM | POA: Diagnosis not present

## 2022-07-15 DIAGNOSIS — G43019 Migraine without aura, intractable, without status migrainosus: Secondary | ICD-10-CM | POA: Diagnosis not present

## 2022-08-22 DIAGNOSIS — Z8669 Personal history of other diseases of the nervous system and sense organs: Secondary | ICD-10-CM | POA: Diagnosis not present

## 2022-08-22 DIAGNOSIS — J309 Allergic rhinitis, unspecified: Secondary | ICD-10-CM | POA: Diagnosis not present

## 2022-08-22 DIAGNOSIS — R7303 Prediabetes: Secondary | ICD-10-CM | POA: Diagnosis not present

## 2022-08-22 DIAGNOSIS — N400 Enlarged prostate without lower urinary tract symptoms: Secondary | ICD-10-CM | POA: Diagnosis not present

## 2022-08-22 DIAGNOSIS — F338 Other recurrent depressive disorders: Secondary | ICD-10-CM | POA: Diagnosis not present

## 2022-08-22 DIAGNOSIS — F419 Anxiety disorder, unspecified: Secondary | ICD-10-CM | POA: Diagnosis not present

## 2022-08-22 DIAGNOSIS — E038 Other specified hypothyroidism: Secondary | ICD-10-CM | POA: Diagnosis not present

## 2022-08-22 DIAGNOSIS — E7800 Pure hypercholesterolemia, unspecified: Secondary | ICD-10-CM | POA: Diagnosis not present

## 2022-08-22 DIAGNOSIS — Z0000 Encounter for general adult medical examination without abnormal findings: Secondary | ICD-10-CM | POA: Diagnosis not present

## 2022-08-26 DIAGNOSIS — H2513 Age-related nuclear cataract, bilateral: Secondary | ICD-10-CM | POA: Diagnosis not present

## 2022-08-26 DIAGNOSIS — H401131 Primary open-angle glaucoma, bilateral, mild stage: Secondary | ICD-10-CM | POA: Diagnosis not present

## 2022-12-30 DIAGNOSIS — G43719 Chronic migraine without aura, intractable, without status migrainosus: Secondary | ICD-10-CM | POA: Diagnosis not present

## 2023-04-11 DIAGNOSIS — H401131 Primary open-angle glaucoma, bilateral, mild stage: Secondary | ICD-10-CM | POA: Diagnosis not present

## 2023-04-15 DIAGNOSIS — F325 Major depressive disorder, single episode, in full remission: Secondary | ICD-10-CM | POA: Diagnosis not present

## 2023-04-15 DIAGNOSIS — R011 Cardiac murmur, unspecified: Secondary | ICD-10-CM | POA: Diagnosis not present

## 2023-04-15 DIAGNOSIS — J309 Allergic rhinitis, unspecified: Secondary | ICD-10-CM | POA: Diagnosis not present

## 2023-04-15 DIAGNOSIS — I739 Peripheral vascular disease, unspecified: Secondary | ICD-10-CM | POA: Diagnosis not present

## 2023-04-15 DIAGNOSIS — E785 Hyperlipidemia, unspecified: Secondary | ICD-10-CM | POA: Diagnosis not present

## 2023-04-15 DIAGNOSIS — F411 Generalized anxiety disorder: Secondary | ICD-10-CM | POA: Diagnosis not present

## 2023-04-15 DIAGNOSIS — R030 Elevated blood-pressure reading, without diagnosis of hypertension: Secondary | ICD-10-CM | POA: Diagnosis not present

## 2023-04-15 DIAGNOSIS — G43909 Migraine, unspecified, not intractable, without status migrainosus: Secondary | ICD-10-CM | POA: Diagnosis not present

## 2023-04-15 DIAGNOSIS — H409 Unspecified glaucoma: Secondary | ICD-10-CM | POA: Diagnosis not present

## 2023-06-11 DIAGNOSIS — M170 Bilateral primary osteoarthritis of knee: Secondary | ICD-10-CM | POA: Diagnosis not present

## 2023-07-21 DIAGNOSIS — G43019 Migraine without aura, intractable, without status migrainosus: Secondary | ICD-10-CM | POA: Diagnosis not present

## 2023-09-30 DIAGNOSIS — H401131 Primary open-angle glaucoma, bilateral, mild stage: Secondary | ICD-10-CM | POA: Diagnosis not present

## 2023-10-16 DIAGNOSIS — R7303 Prediabetes: Secondary | ICD-10-CM | POA: Diagnosis not present

## 2023-10-16 DIAGNOSIS — E7800 Pure hypercholesterolemia, unspecified: Secondary | ICD-10-CM | POA: Diagnosis not present

## 2023-10-16 DIAGNOSIS — Z1331 Encounter for screening for depression: Secondary | ICD-10-CM | POA: Diagnosis not present

## 2023-10-16 DIAGNOSIS — I700 Atherosclerosis of aorta: Secondary | ICD-10-CM | POA: Diagnosis not present

## 2023-10-16 DIAGNOSIS — J309 Allergic rhinitis, unspecified: Secondary | ICD-10-CM | POA: Diagnosis not present

## 2023-10-16 DIAGNOSIS — E038 Other specified hypothyroidism: Secondary | ICD-10-CM | POA: Diagnosis not present

## 2023-10-16 DIAGNOSIS — F339 Major depressive disorder, recurrent, unspecified: Secondary | ICD-10-CM | POA: Diagnosis not present

## 2023-10-16 DIAGNOSIS — Z0000 Encounter for general adult medical examination without abnormal findings: Secondary | ICD-10-CM | POA: Diagnosis not present

## 2023-10-16 DIAGNOSIS — F419 Anxiety disorder, unspecified: Secondary | ICD-10-CM | POA: Diagnosis not present

## 2023-11-03 DIAGNOSIS — D128 Benign neoplasm of rectum: Secondary | ICD-10-CM | POA: Diagnosis not present

## 2023-11-03 DIAGNOSIS — K635 Polyp of colon: Secondary | ICD-10-CM | POA: Diagnosis not present

## 2023-11-03 DIAGNOSIS — Z800 Family history of malignant neoplasm of digestive organs: Secondary | ICD-10-CM | POA: Diagnosis not present

## 2023-11-03 DIAGNOSIS — Z09 Encounter for follow-up examination after completed treatment for conditions other than malignant neoplasm: Secondary | ICD-10-CM | POA: Diagnosis not present

## 2023-11-03 DIAGNOSIS — Z860100 Personal history of colon polyps, unspecified: Secondary | ICD-10-CM | POA: Diagnosis not present

## 2023-11-03 DIAGNOSIS — K5730 Diverticulosis of large intestine without perforation or abscess without bleeding: Secondary | ICD-10-CM | POA: Diagnosis not present

## 2023-11-05 DIAGNOSIS — K635 Polyp of colon: Secondary | ICD-10-CM | POA: Diagnosis not present

## 2023-11-05 DIAGNOSIS — D128 Benign neoplasm of rectum: Secondary | ICD-10-CM | POA: Diagnosis not present

## 2023-12-15 DIAGNOSIS — R197 Diarrhea, unspecified: Secondary | ICD-10-CM | POA: Diagnosis not present

## 2023-12-19 DIAGNOSIS — K649 Unspecified hemorrhoids: Secondary | ICD-10-CM | POA: Diagnosis not present

## 2023-12-19 DIAGNOSIS — K5730 Diverticulosis of large intestine without perforation or abscess without bleeding: Secondary | ICD-10-CM | POA: Diagnosis not present

## 2023-12-19 DIAGNOSIS — K52832 Lymphocytic colitis: Secondary | ICD-10-CM | POA: Diagnosis not present

## 2023-12-19 DIAGNOSIS — R197 Diarrhea, unspecified: Secondary | ICD-10-CM | POA: Diagnosis not present

## 2023-12-23 DIAGNOSIS — K52832 Lymphocytic colitis: Secondary | ICD-10-CM | POA: Diagnosis not present

## 2024-01-05 DIAGNOSIS — K52832 Lymphocytic colitis: Secondary | ICD-10-CM | POA: Diagnosis not present

## 2024-01-12 DIAGNOSIS — G43719 Chronic migraine without aura, intractable, without status migrainosus: Secondary | ICD-10-CM | POA: Diagnosis not present

## 2024-01-12 DIAGNOSIS — G43019 Migraine without aura, intractable, without status migrainosus: Secondary | ICD-10-CM | POA: Diagnosis not present

## 2024-02-02 DIAGNOSIS — D225 Melanocytic nevi of trunk: Secondary | ICD-10-CM | POA: Diagnosis not present

## 2024-02-02 DIAGNOSIS — L821 Other seborrheic keratosis: Secondary | ICD-10-CM | POA: Diagnosis not present

## 2024-02-02 DIAGNOSIS — L281 Prurigo nodularis: Secondary | ICD-10-CM | POA: Diagnosis not present

## 2024-02-02 DIAGNOSIS — L814 Other melanin hyperpigmentation: Secondary | ICD-10-CM | POA: Diagnosis not present

## 2024-02-12 DIAGNOSIS — K52832 Lymphocytic colitis: Secondary | ICD-10-CM | POA: Diagnosis not present

## 2024-03-12 ENCOUNTER — Other Ambulatory Visit: Payer: Self-pay | Admitting: Medical Genetics

## 2024-03-30 DIAGNOSIS — H18513 Endothelial corneal dystrophy, bilateral: Secondary | ICD-10-CM | POA: Diagnosis not present

## 2024-03-30 DIAGNOSIS — H5213 Myopia, bilateral: Secondary | ICD-10-CM | POA: Diagnosis not present

## 2024-03-30 DIAGNOSIS — H2513 Age-related nuclear cataract, bilateral: Secondary | ICD-10-CM | POA: Diagnosis not present

## 2024-03-30 DIAGNOSIS — H401231 Low-tension glaucoma, bilateral, mild stage: Secondary | ICD-10-CM | POA: Diagnosis not present

## 2024-03-31 DIAGNOSIS — K52832 Lymphocytic colitis: Secondary | ICD-10-CM | POA: Diagnosis not present

## 2024-04-22 ENCOUNTER — Other Ambulatory Visit (HOSPITAL_COMMUNITY)
Admission: RE | Admit: 2024-04-22 | Discharge: 2024-04-22 | Disposition: A | Discharge status: Home / Self Care | Payer: Self-pay | Source: Ambulatory Visit | Attending: Medical Genetics | Admitting: Medical Genetics

## 2024-05-03 ENCOUNTER — Encounter (INDEPENDENT_AMBULATORY_CARE_PROVIDER_SITE_OTHER): Payer: Self-pay

## 2024-05-07 LAB — GENECONNECT MOLECULAR SCREEN: Genetic Analysis Overall Interpretation: NEGATIVE

## 2024-05-26 DIAGNOSIS — M25561 Pain in right knee: Secondary | ICD-10-CM | POA: Diagnosis not present

## 2024-05-26 DIAGNOSIS — M25562 Pain in left knee: Secondary | ICD-10-CM | POA: Diagnosis not present

## 2024-06-18 DIAGNOSIS — M2041 Other hammer toe(s) (acquired), right foot: Secondary | ICD-10-CM | POA: Diagnosis not present

## 2024-06-18 DIAGNOSIS — M2042 Other hammer toe(s) (acquired), left foot: Secondary | ICD-10-CM | POA: Diagnosis not present

## 2024-07-05 DIAGNOSIS — L2989 Other pruritus: Secondary | ICD-10-CM | POA: Diagnosis not present

## 2024-07-05 DIAGNOSIS — L538 Other specified erythematous conditions: Secondary | ICD-10-CM | POA: Diagnosis not present

## 2024-07-05 DIAGNOSIS — Z789 Other specified health status: Secondary | ICD-10-CM | POA: Diagnosis not present

## 2024-07-05 DIAGNOSIS — L820 Inflamed seborrheic keratosis: Secondary | ICD-10-CM | POA: Diagnosis not present

## 2024-07-05 DIAGNOSIS — R208 Other disturbances of skin sensation: Secondary | ICD-10-CM | POA: Diagnosis not present

## 2024-07-21 DIAGNOSIS — L538 Other specified erythematous conditions: Secondary | ICD-10-CM | POA: Diagnosis not present

## 2024-07-21 DIAGNOSIS — Z789 Other specified health status: Secondary | ICD-10-CM | POA: Diagnosis not present

## 2024-07-21 DIAGNOSIS — L820 Inflamed seborrheic keratosis: Secondary | ICD-10-CM | POA: Diagnosis not present

## 2024-07-21 DIAGNOSIS — L2989 Other pruritus: Secondary | ICD-10-CM | POA: Diagnosis not present

## 2024-07-21 DIAGNOSIS — R208 Other disturbances of skin sensation: Secondary | ICD-10-CM | POA: Diagnosis not present

## 2024-07-27 DIAGNOSIS — M25562 Pain in left knee: Secondary | ICD-10-CM | POA: Diagnosis not present

## 2024-07-27 DIAGNOSIS — G8929 Other chronic pain: Secondary | ICD-10-CM | POA: Diagnosis not present

## 2024-07-27 DIAGNOSIS — M25561 Pain in right knee: Secondary | ICD-10-CM | POA: Diagnosis not present

## 2024-07-27 DIAGNOSIS — M170 Bilateral primary osteoarthritis of knee: Secondary | ICD-10-CM | POA: Diagnosis not present

## 2024-08-30 ENCOUNTER — Encounter (INDEPENDENT_AMBULATORY_CARE_PROVIDER_SITE_OTHER): Payer: Self-pay | Admitting: Otolaryngology

## 2024-08-30 ENCOUNTER — Ambulatory Visit (INDEPENDENT_AMBULATORY_CARE_PROVIDER_SITE_OTHER): Admitting: Otolaryngology

## 2024-08-30 VITALS — BP 114/73 | HR 70 | Temp 97.9°F | Ht 72.0 in | Wt 170.0 lb

## 2024-08-30 DIAGNOSIS — R42 Dizziness and giddiness: Secondary | ICD-10-CM | POA: Insufficient documentation

## 2024-08-30 DIAGNOSIS — H9311 Tinnitus, right ear: Secondary | ICD-10-CM | POA: Diagnosis not present

## 2024-08-30 NOTE — Progress Notes (Signed)
 CC: Recurrent dizziness, right ear tinnitus  Discussed the use of AI scribe software for clinical note transcription with the patient, who gave verbal consent to proceed.  History of Present Illness Derrick Allen is a 73 year old male who presents with right ear tinnitus and episodes of dizziness.  He began experiencing tinnitus and vertigo following a colonoscopy earlier this year. Three days post-procedure, he developed diarrhea and was diagnosed with microscopic colitis, for which he was prescribed prednisone among other medications. He noticed tinnitus initially in both ears, but now primarily in the right ear, described as a high-pitched ringing sound.  It is nonpulsatile.  He has experienced three episodes of dizziness, each lasting a few hours, with the last episode occurring approximately two and a half to three months ago.  He describes the dizziness as a spinning vertigo.  During these episodes, he felt a spinning sensation that required him to lie down. He did not lose consciousness but found the sensation distressing.   He recalls an incident at the gym where he experienced vertigo and had difficulty walking, necessitating his wife to pick him up. No ongoing balance issues when not experiencing vertigo.  Approximately a month ago, he experienced significant pressure in his right ear, which temporarily affected his hearing, but this has since resolved. No history of ear infections or surgeries, although he mentions possible minor ear infections during childhood.   Past Medical History:  Diagnosis Date   Dyslipidemia    Impaired fasting glucose     History reviewed. No pertinent surgical history.  Family History  Problem Relation Age of Onset   Colon cancer Mother    AAA (abdominal aortic aneurysm) Mother     Social History:  reports that he has never smoked. He has never used smokeless tobacco. No history on file for alcohol use and drug use.  Allergies:  Allergies   Allergen Reactions   Amoxicillin Hives and Other (See Comments)    Hives     Prior to Admission medications   Medication Sig Start Date End Date Taking? Authorizing Provider  dorzolamide-timolol (COSOPT) 22.3-6.8 MG/ML ophthalmic solution INSTILL ONE DROP BY OPHTHALMIC ROUTE TWICE EVERY DAY INTO AFFECTED EYE(S) 04/21/17   [provider]  Fluorouracil  (TOLAK ) 4 % CREA Apply 1 application topically at bedtime. 09/27/20   Livingston Rigg, MD  latanoprost (XALATAN) 0.005 % ophthalmic solution Place 1 drop into both eyes daily. 04/21/17   [provider]  simvastatin (ZOCOR) 40 MG tablet Take 40 mg by mouth daily.    [provider]  SUMAtriptan (IMITREX) 25 MG tablet Take 25 mg by mouth as needed.    [provider]  venlafaxine XR (EFFEXOR-XR) 150 MG 24 hr capsule Take 150 mg by mouth daily.    [provider]  zonisamide (ZONEGRAN) 100 MG capsule Take 100 mg by mouth daily. 04/21/17   [provider]    Blood pressure 114/73, pulse 70, temperature 97.9 F (36.6 C), height 6' (1.829 m), weight 170 lb (77.1 kg), SpO2 95%. Exam: General: Communicates without difficulty, well nourished, no acute distress. Head: Normocephalic, no evidence injury, no tenderness, facial buttresses intact without stepoff. Face/sinus: No tenderness to palpation and percussion. Facial movement is normal and symmetric. Eyes: PERRL, EOMI. No scleral icterus, conjunctivae clear. Neuro: CN II exam reveals vision grossly intact.  No nystagmus at any point of gaze. Ears: Auricles well formed without lesions.  Ear canals are intact without mass or lesion.  No erythema or edema  is appreciated.  The TMs are intact without fluid. Nose: External evaluation reveals normal support and skin without lesions.  Dorsum is intact.  Anterior rhinoscopy reveals congested mucosa over anterior aspect of inferior turbinates and intact septum.  No purulence noted. Oral:  Oral cavity and oropharynx  are intact, symmetric, without erythema or edema.  Mucosa is moist without lesions. Neck: Full range of motion without pain.  There is no significant lymphadenopathy.  No masses palpable.  Thyroid  bed within normal limits to palpation.  Parotid glands and submandibular glands equal bilaterally without mass.  Trachea is midline. Neuro:  CN 2-12 grossly intact. Vestibular: No nystagmus at any point of gaze. Dix Hallpike negative.Vestibular: There is no nystagmus with pneumatic pressure on either tympanic membrane or Valsalva. The cerebellar examination is unremarkable.   Assessment and Plan Assessment & Plan Recurrent dizziness and right-sided tinnitus Recurrent vertigo episodes lasting a few hours, with the last episode occurring 2.5 to 3 months ago. Right-sided tinnitus persists but is improving. No ear infections or surgeries. Physical examination shows normal ear canals, eardrums, and middle ear spaces.  - Recurrent dizziness of unknown etiology. The possible differential diagnoses include Meniere's disease, transient BPPV, vestibular migraine, peripheral vestibular dysfunction, or other central/systemic causes.   - Ordered hearing test to evaluate for asymmetric hearing loss. - Advised reduction of salt intake to treat possible Mnire's disease. - The pathophysiology of vestibular dysfunction and dizziness are discussed extensively with the patient. The possible differential diagnoses are reviewed. Questions are invited and answered.   - Will consider vestibular therapy if vertigo recurs. - The strategies to cope with tinnitus, including the use of masker, hearing aids, tinnitus retraining therapy, and avoidance of caffeine and alcohol are discussed.   Mionna Advincula W Soliyana Mcchristian 08/30/2024, 2:27 PM

## 2024-09-06 ENCOUNTER — Ambulatory Visit (INDEPENDENT_AMBULATORY_CARE_PROVIDER_SITE_OTHER): Admitting: Audiology

## 2024-09-06 DIAGNOSIS — H90A31 Mixed conductive and sensorineural hearing loss, unilateral, right ear with restricted hearing on the contralateral side: Secondary | ICD-10-CM

## 2024-09-06 DIAGNOSIS — H906 Mixed conductive and sensorineural hearing loss, bilateral: Secondary | ICD-10-CM | POA: Diagnosis not present

## 2024-09-06 NOTE — Progress Notes (Unsigned)
  [address] [place], [place] [address] [phone]  Audiological Evaluation    Name: Derrick Allen     DOB:   04/16/1951      MRN:   [id number]                                                                                     Service Date: 09/06/2024     Accompanied by: unaccompanied   Patient comes today after Dr. Karis, ENT sent a referral for a hearing evaluation due to concerns with dizziness.   Symptoms Yes Details  Hearing loss  []    Tinnitus  [x]  Right ear - constant   Ear pain/ infections/pressure  []    Balance problems  [x]  vertigo  Noise exposure history  []    Previous ear surgeries  []    Family history of hearing loss  []    Amplification  []    Other  []      Otoscopy: Right ear: Clear external ear canal and notable landmarks visualized on the tympanic membrane. Left ear:  Clear external ear canal and notable landmarks visualized on the tympanic membrane.  Tympanometry: Right ear: Normal external ear canal volume with normal middle ear pressure and tympanic membrane compliance (Type A). Findings are suggestive of normal middle ear function. Left ear: Normal external ear canal volume with normal middle ear pressure and tympanic membrane compliance (Type A). Findings are suggestive of normal middle ear function.    Hearing Evaluation The hearing test results were completed under headphones and results are deemed to be of good reliability. Test technique:  conventional    Pure tone Audiometry: Right ear- *** {hearing loss types:31372::sensorineural hearing loss} from *** Hz - *** Hz. Left ear-  *** {hearing loss types:31372::sensorineural hearing loss} from *** Hz - *** Hz.  Speech Audiometry: Right ear- Speech Reception Threshold (SRT) was obtained at 15 dBHL. Left ear-Speech Reception Threshold (SRT) was obtained at 10 dBHL.   Word Recognition Score Tested using NU-6 (recorded) Right ear: 92% was obtained at a presentation level of 65  dBHL with contralateral masking which is deemed as  excellent. Left ear: ***% was obtained at a presentation level of 65 dBHL with contralateral masking which is deemed as  {word recognition score:31373}.   Impression: {Word recognition Score interpretation:31432::There is not a significant difference in pure-tone thresholds between ears.,There is not a significant difference in the word recognition score in between ears. }   Recommendations: {Audiology Recommendations:31370::Follow up with ENT as scheduled for today.}   Samina Weekes MARIE LEROUX-MARTINEZ, AUD

## 2024-10-05 DIAGNOSIS — H43811 Vitreous degeneration, right eye: Secondary | ICD-10-CM | POA: Diagnosis not present

## 2024-10-05 DIAGNOSIS — H2513 Age-related nuclear cataract, bilateral: Secondary | ICD-10-CM | POA: Diagnosis not present

## 2024-10-05 DIAGNOSIS — H401231 Low-tension glaucoma, bilateral, mild stage: Secondary | ICD-10-CM | POA: Diagnosis not present

## 2024-10-07 ENCOUNTER — Ambulatory Visit (INDEPENDENT_AMBULATORY_CARE_PROVIDER_SITE_OTHER): Admitting: Otolaryngology

## 2024-10-07 ENCOUNTER — Encounter (INDEPENDENT_AMBULATORY_CARE_PROVIDER_SITE_OTHER): Payer: Self-pay | Admitting: Otolaryngology

## 2024-10-07 VITALS — BP 109/57 | HR 76 | Temp 98.1°F

## 2024-10-07 DIAGNOSIS — H903 Sensorineural hearing loss, bilateral: Secondary | ICD-10-CM | POA: Insufficient documentation

## 2024-10-07 DIAGNOSIS — H9311 Tinnitus, right ear: Secondary | ICD-10-CM | POA: Diagnosis not present

## 2024-10-07 DIAGNOSIS — R42 Dizziness and giddiness: Secondary | ICD-10-CM | POA: Diagnosis not present

## 2024-10-07 NOTE — Progress Notes (Signed)
 Patient ID: Derrick Allen, male   DOB: [birth year], 73 y.o.   MRN: [id number]  Follow up: Recurrent dizziness, right-sided tinnitus  History of Present Illness Derrick Allen is a 73 year old male with recurrent dizziness, bilateral sensorineural hearing loss, and right ear tinnitus who presents for otolaryngology follow-up.  He has not experienced any further episodes of vertigo since his last visit approximately one month ago.  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  He was instructed to start a 1500 mg low-salt diet.  He reports that his tinnitus is probably a little better though not completely resolved.  His recent hearing test showed bilateral high-frequency sensorineural hearing loss, worse on the right side at low frequencies.  He continues to follow a low-sodium diet.  No new otologic symptoms have developed since the previous visit.  Exam: General: Communicates without difficulty, well nourished, no acute distress. Head: Normocephalic, no evidence injury, no tenderness, facial buttresses intact without stepoff. Face/sinus: No tenderness to palpation and percussion. Facial movement is normal and symmetric. Eyes: PERRL, EOMI. No scleral icterus, conjunctivae clear. Neuro: CN II exam reveals vision grossly intact.  No nystagmus at any point of gaze. Ears: Auricles well formed without lesions.  Ear canals are intact without mass or lesion.  No erythema or edema is appreciated.  The TMs are intact without fluid. Nose: External evaluation reveals normal support and skin without lesions.  Dorsum is intact.  Anterior rhinoscopy reveals normal mucosa over anterior aspect of inferior turbinates and intact septum.  No purulence noted. Oral:  Oral cavity and oropharynx are intact, symmetric, without erythema or edema.  Mucosa is moist without lesions. Neck: Full range of motion without pain.  There is no significant lymphadenopathy.  No masses palpable.  Thyroid  bed within  normal limits to palpation.  Parotid glands and submandibular glands equal bilaterally without mass.  Trachea is midline. Neuro:  CN 2-12 grossly intact.    Assessment and Plan Assessment & Plan Recurrent dizziness with bilateral sensorineural hearing loss and right ear tinnitus His dizziness is currently under controlled, with mild improvement in tinnitus. Bilateral high-frequency sensorineural hearing loss is primarily age-related, with greater low-frequency loss in the right ear, suggesting Meniere's involvement.  - Reinforced low sodium diet to manage dizziness, aural fullness, and tinnitus. -The pathophysiology of dizziness and Mnire's disease are extensively discussed.  Questions were invited and answered. - Provided copy of audiogram for reference and insurance documentation. - The patient is a candidate for hearing amplification.  Hearing aid options are discussed. - Scheduled follow-up in six months to reassess symptoms and hearing status.

## 2025-04-15 ENCOUNTER — Ambulatory Visit (INDEPENDENT_AMBULATORY_CARE_PROVIDER_SITE_OTHER): Admitting: Otolaryngology
